# Patient Record
Sex: Male | Born: 1985 | Race: Black or African American | Hispanic: No | Marital: Single | Smoking: Current some day smoker
Health system: Southern US, Community
[De-identification: ages and names within clinical notes are randomized; demographics above are authoritative.]

---

## 2016-08-26 ENCOUNTER — Emergency Department (HOSPITAL_COMMUNITY): Payer: Self-pay

## 2016-08-26 ENCOUNTER — Emergency Department (HOSPITAL_COMMUNITY)
Admission: EM | Admit: 2016-08-26 | Discharge: 2016-08-26 | Disposition: A | Discharge status: Home / Self Care | Payer: Self-pay | Attending: Emergency Medicine | Admitting: Emergency Medicine

## 2016-08-26 ENCOUNTER — Encounter (HOSPITAL_COMMUNITY): Payer: Self-pay

## 2016-08-26 DIAGNOSIS — Y939 Activity, unspecified: Secondary | ICD-10-CM | POA: Insufficient documentation

## 2016-08-26 DIAGNOSIS — Y929 Unspecified place or not applicable: Secondary | ICD-10-CM | POA: Insufficient documentation

## 2016-08-26 DIAGNOSIS — W500XXA Accidental hit or strike by another person, initial encounter: Secondary | ICD-10-CM | POA: Insufficient documentation

## 2016-08-26 DIAGNOSIS — Y999 Unspecified external cause status: Secondary | ICD-10-CM | POA: Insufficient documentation

## 2016-08-26 DIAGNOSIS — S060X1A Concussion with loss of consciousness of 30 minutes or less, initial encounter: Secondary | ICD-10-CM | POA: Insufficient documentation

## 2016-08-26 DIAGNOSIS — R55 Syncope and collapse: Secondary | ICD-10-CM | POA: Insufficient documentation

## 2016-08-26 DIAGNOSIS — F17200 Nicotine dependence, unspecified, uncomplicated: Secondary | ICD-10-CM | POA: Insufficient documentation

## 2016-08-26 MED ORDER — ACETAMINOPHEN 325 MG PO TABS
650.0000 mg | ORAL_TABLET | Freq: Once | ORAL | Status: AC
  Administered 2016-08-26: 650 mg via ORAL
  Filled 2016-08-26: qty 2

## 2016-08-26 MED ORDER — IBUPROFEN 400 MG PO TABS
400.0000 mg | ORAL_TABLET | Freq: Once | ORAL | Status: AC
  Administered 2016-08-26: 400 mg via ORAL
  Filled 2016-08-26: qty 1
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
## 2016-08-26 NOTE — ED Notes (Signed)
Pt A&OX4, ambulatory at d/c with steady gait, NAD 
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
